# Patient Record
Sex: Male | Born: 1995 | Race: Black or African American | Hispanic: No | Marital: Single | Smoking: Current every day smoker
Health system: Southern US, Community
[De-identification: ages and names within clinical notes are randomized; demographics above are authoritative.]

---
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
## 2016-05-24 ENCOUNTER — Encounter (HOSPITAL_COMMUNITY): Payer: Self-pay | Admitting: Emergency Medicine

## 2016-05-24 ENCOUNTER — Ambulatory Visit (HOSPITAL_COMMUNITY)
Admission: EM | Admit: 2016-05-24 | Discharge: 2016-05-24 | Disposition: A | Discharge status: Home / Self Care | Payer: BLUE CROSS/BLUE SHIELD | Attending: Emergency Medicine | Admitting: Emergency Medicine

## 2016-05-24 DIAGNOSIS — L309 Dermatitis, unspecified: Secondary | ICD-10-CM

## 2016-05-24 MED ORDER — METHYLPREDNISOLONE 4 MG PO TBPK: ORAL_TABLET | ORAL | 0 refills | Status: DC

## 2016-05-24 MED ORDER — HYDROXYZINE HCL 25 MG PO TABS: 25.0000 mg | ORAL_TABLET | Freq: Four times a day (QID) | ORAL | 0 refills | Status: DC

## 2016-05-24 NOTE — ED Triage Notes (Signed)
[facility] a [facility] itching when he gets hot. The patient stated that he does not sweat and thus when he gets hot he itches. The patient stated that this happened about 2 years ago.

## 2016-05-24 NOTE — ED Provider Notes (Signed)
CSN: [id number]     Arrival date & time 05/24/16  1357 History   First MD Initiated Contact with Patient 05/24/16 1544     Chief Complaint  Patient presents with  . Rash   (Consider location/radiation/quality/duration/timing/severity/associated sxs/prior Treatment) Patient c/o itching and rash on chest and arms.   The history is provided by the patient.  Rash  Location:  Shoulder/arm and hand Shoulder/arm rash location:  R shoulder Quality: itchiness   Severity:  Moderate Onset quality:  Sudden Duration:  1 day Timing:  Intermittent Progression:  Waxing and waning Chronicity:  New Relieved by:  Nothing Worsened by:  Nothing   History reviewed. No pertinent past medical history. History reviewed. No pertinent surgical history. History reviewed. No pertinent family history. Social History  Substance Use Topics  . Smoking status: Not on file  . Smokeless tobacco: Not on file  . Alcohol use Not on file    Review of Systems  Constitutional: Negative.   HENT: Negative.   Eyes: Negative.   Respiratory: Negative.   Cardiovascular: Negative.   Gastrointestinal: Negative.   Endocrine: Negative.   Genitourinary: Negative.   Musculoskeletal: Negative.   Skin: Positive for rash.  Allergic/Immunologic: Negative.   Neurological: Negative.     Allergies  Patient has no known allergies.  Home Medications   Prior to Admission medications   Medication Sig Start Date End Date Taking? Authorizing Provider  fexofenadine-pseudoephedrine (ALLEGRA-D) 60-120 MG per tablet Take 1 tablet by mouth every 12 (twelve) hours. 10/04/12   Reuben Likesavid C Keller, MD  fluticasone (FLONASE) 50 MCG/ACT nasal spray Place 2 sprays into the nose daily. 10/04/12   Reuben Likesavid C Keller, MD  guaiFENesin-codeine [facility](GUIATUSS AC) 100-10 MG/5ML syrup Take 10 mLs by mouth 4 (four) times daily as needed for cough. 10/04/12   Reuben Likesavid C Keller, MD  hydrOXYzine (ATARAX/VISTARIL) 25 MG tablet Take 1 tablet (25 mg total) by mouth  every 6 (six) hours. 05/24/16   Deatra CanterWilliam J Jonavin Seder, FNP  methylPREDNISolone (MEDROL DOSEPAK) 4 MG TBPK tablet Take 6-5-4-3-2-1 po qd 05/24/16   Deatra CanterWilliam J Grisel Blumenstock, FNP  predniSONE (DELTASONE) 20 MG tablet Take 1 tablet (20 mg total) by mouth 2 (two) times daily. 10/04/12   Reuben Likesavid C Keller, MD  terbinafine (LAMISIL) 1 % cream Apply topically 2 (two) times daily. 10/04/12   Reuben Likesavid C Keller, MD   Meds Ordered and Administered this Visit  Medications - No data to display  BP 125/79 (BP Location: Right Arm)   Pulse 69   Temp 98.2 F (36.8 C) (Oral)   Resp 16   SpO2 99%  No data found.   Physical Exam  Constitutional: He appears well-developed and well-nourished.  HENT:  Head: Normocephalic and atraumatic.  Eyes: EOM are normal. Pupils are equal, round, and reactive to light.  Neck: Normal range of motion. Neck supple.  Cardiovascular: Normal rate.   Pulmonary/Chest: Effort normal and breath sounds normal.  Skin: Rash noted.  Nursing note and vitals reviewed.   Urgent Care Course   Clinical Course     Procedures (including critical care time)  Labs Review Labs Reviewed - No data to display  Imaging Review No results found.   Visual Acuity Review  Right Eye Distance:   Left Eye Distance:   Bilateral Distance:    Right Eye Near:   Left Eye Near:    Bilateral Near:         MDM   1. Dermatitis    Medrol dose pack Hydroxyzine  Deatra CanterWilliam J Angelgabriel Willmore, FNP 05/24/16 1550

## 2016-06-20 ENCOUNTER — Ambulatory Visit (HOSPITAL_COMMUNITY)
Admission: EM | Admit: 2016-06-20 | Discharge: 2016-06-20 | Disposition: A | Discharge status: Home / Self Care | Payer: Self-pay | Attending: Family Medicine | Admitting: Family Medicine

## 2016-06-20 ENCOUNTER — Encounter (HOSPITAL_COMMUNITY): Payer: Self-pay | Admitting: *Deleted

## 2016-06-20 DIAGNOSIS — B356 Tinea cruris: Secondary | ICD-10-CM

## 2016-06-20 MED ORDER — CLOTRIMAZOLE-BETAMETHASONE 1-0.05 % EX CREA: TOPICAL_CREAM | CUTANEOUS | 1 refills | Status: DC

## 2016-06-20 NOTE — ED Triage Notes (Signed)
Also has   Some  Swelling of  lower lip  Noticed   Yesterday

## 2016-06-20 NOTE — ED Triage Notes (Signed)
Rash  On  Penis  That   Itched     Before    Stopped   sev   Days       denys    Any    Discharge  Or burning  On  Urination

## 2016-06-20 NOTE — ED Provider Notes (Addendum)
[facility]    CSN: [id number] Arrival date & time: 06/20/16  1159     History   Chief Complaint Chief Complaint  Patient presents with  . Rash    HPI Adam Kelley is a 21 y.o. male.   The history is provided by the patient and a parent.  Rash  Location:  Pelvis Pelvic rash location:  Penis Quality: itchiness and redness   Quality: not painful   Severity:  Mild Onset quality:  Gradual Duration:  1 week Progression:  Spreading Chronicity:  New Relieved by:  None tried Worsened by:  Nothing Ineffective treatments:  None tried Associated symptoms: no fever     History reviewed. No pertinent past medical history.  There are no active problems to display for this patient.   History reviewed. No pertinent surgical history.     Home Medications    Prior to Admission medications   Medication Sig Start Date End Date Taking? Authorizing Provider  clotrimazole-betamethasone (LOTRISONE) cream Apply to affected area 2 times daily prn 06/20/16   Adam Hoff, MD  fexofenadine-pseudoephedrine (ALLEGRA-D) 60-120 MG per tablet Take 1 tablet by mouth every 12 (twelve) hours. 10/04/12   Reuben Likes, MD  fluticasone (FLONASE) 50 MCG/ACT nasal spray Place 2 sprays into the nose daily. 10/04/12   Reuben Likes, MD  guaiFENesin-codeine [facility]) 100-10 MG/5ML syrup Take 10 mLs by mouth 4 (four) times daily as needed for cough. 10/04/12   Reuben Likes, MD  hydrOXYzine (ATARAX/VISTARIL) 25 MG tablet Take 1 tablet (25 mg total) by mouth every 6 (six) hours. 05/24/16   Deatra Canter, FNP  methylPREDNISolone (MEDROL DOSEPAK) 4 MG TBPK tablet Take 6-5-4-3-2-1 po qd 05/24/16   Deatra Canter, FNP  predniSONE (DELTASONE) 20 MG tablet Take 1 tablet (20 mg total) by mouth 2 (two) times daily. 10/04/12   Reuben Likes, MD  terbinafine (LAMISIL) 1 % cream Apply topically 2 (two) times daily. 10/04/12   Reuben Likes, MD    Family History History reviewed. No pertinent  family history.  Social History Social History  Substance Use Topics  . Smoking status: Never Smoker  . Smokeless tobacco: Never Used  . Alcohol use No     Allergies   Patient has no known allergies.   Review of Systems Review of Systems  Constitutional: Negative for fever.  Musculoskeletal: Negative.   Skin: Positive for rash.  Neurological: Negative.      Physical Exam Triage Vital Signs ED Triage Vitals [06/20/16 1343]  Enc Vitals Group     BP 168/70     Pulse Rate 78     Resp 18     Temp 97.5 F (36.4 C)     Temp src      SpO2 100 %     Weight      Height      Head Circumference      Peak Flow      Pain Score      Pain Loc      Pain Edu?      Excl. in GC?    No data found.   Updated Vital Signs BP 168/70 (BP Location: Left Arm)   Pulse 78   Temp 97.5 F (36.4 C)   Resp 18   SpO2 100%   Visual Acuity Right Eye Distance:   Left Eye Distance:   Bilateral Distance:    Right Eye Near:   Left Eye Near:  Bilateral Near:     Physical Exam  Constitutional: He is oriented to person, place, and time. He appears well-developed and well-nourished. No distress.  Neurological: He is alert and oriented to person, place, and time. No cranial nerve deficit.  Skin: Skin is warm and dry. Rash noted.  Nursing note and vitals reviewed.    UC Treatments / Results  Labs (all labs ordered are listed, but only abnormal results are displayed) Labs Reviewed - No data to display  EKG  EKG Interpretation None       Radiology No results found.  Procedures Procedures (including critical care time)  Medications Ordered in UC Medications - No data to display   Initial Impression / Assessment and Plan / UC Course  I have reviewed the triage vital signs and the nursing notes.  Pertinent labs & imaging results that were available during my care of the patient were reviewed by me and considered in my medical decision making (see chart for details).        Final Clinical Impressions(s) / UC Diagnoses   Final diagnoses:  Tinea cruris    New Prescriptions Discharge Medication List as of 06/20/2016  2:14 PM    START taking these medications   Details  clotrimazole-betamethasone (LOTRISONE) cream Apply to affected area 2 times daily prn, Normal         Adam HoffJames D Terell Kincy, MD 07/03/16 1512    Adam HoffJames D Shanisha Lech, MD 07/04/16 1044

## 2016-10-11 ENCOUNTER — Encounter (HOSPITAL_COMMUNITY): Payer: Self-pay | Admitting: *Deleted

## 2016-10-11 ENCOUNTER — Emergency Department (HOSPITAL_COMMUNITY)
Admission: EM | Admit: 2016-10-11 | Discharge: 2016-10-11 | Disposition: A | Discharge status: Home / Self Care | Payer: BLUE CROSS/BLUE SHIELD | Attending: Emergency Medicine | Admitting: Emergency Medicine

## 2016-10-11 DIAGNOSIS — Z79899 Other long term (current) drug therapy: Secondary | ICD-10-CM | POA: Diagnosis not present

## 2016-10-11 DIAGNOSIS — F329 Major depressive disorder, single episode, unspecified: Secondary | ICD-10-CM | POA: Diagnosis not present

## 2016-10-11 DIAGNOSIS — F32A Depression, unspecified: Secondary | ICD-10-CM

## 2016-10-11 DIAGNOSIS — R45851 Suicidal ideations: Secondary | ICD-10-CM | POA: Diagnosis not present

## 2016-10-11 DIAGNOSIS — Z046 Encounter for general psychiatric examination, requested by authority: Secondary | ICD-10-CM | POA: Diagnosis present

## 2016-10-11 LAB — CBC
HEMATOCRIT: 50.3 % (ref 39.0–52.0)
HEMOGLOBIN: 17.3 g/dL — AB (ref 13.0–17.0)
MCH: 30.7 pg (ref 26.0–34.0)
MCHC: 34.4 g/dL (ref 30.0–36.0)
MCV: 89.3 fL (ref 78.0–100.0)
Platelets: 219 10*3/uL (ref 150–400)
RBC: 5.63 MIL/uL (ref 4.22–5.81)
RDW: 13.6 % (ref 11.5–15.5)
WBC: 10 10*3/uL (ref 4.0–10.5)

## 2016-10-11 LAB — RAPID URINE DRUG SCREEN, HOSP PERFORMED
AMPHETAMINES: NOT DETECTED
BARBITURATES: NOT DETECTED
Benzodiazepines: NOT DETECTED
COCAINE: NOT DETECTED
Opiates: NOT DETECTED
TETRAHYDROCANNABINOL: NOT DETECTED

## 2016-10-11 LAB — COMPREHENSIVE METABOLIC PANEL
ALBUMIN: 4.6 g/dL (ref 3.5–5.0)
ALK PHOS: 59 U/L (ref 38–126)
ALT: 22 U/L (ref 17–63)
AST: 27 U/L (ref 15–41)
Anion gap: 9 (ref 5–15)
BUN: 20 mg/dL (ref 6–20)
CALCIUM: 9.5 mg/dL (ref 8.9–10.3)
CHLORIDE: 104 mmol/L (ref 101–111)
CO2: 24 mmol/L (ref 22–32)
CREATININE: 1.43 mg/dL — AB (ref 0.61–1.24)
GFR calc Af Amer: >60 mL/min (ref >60)
GFR calc non Af Amer: >60 mL/min (ref >60)
GLUCOSE: 97 mg/dL (ref 65–99)
Potassium: 3.8 mmol/L (ref 3.5–5.1)
SODIUM: 137 mmol/L (ref 135–145)
Total Bilirubin: 0.6 mg/dL (ref 0.3–1.2)
Total Protein: 7.7 g/dL (ref 6.5–8.1)

## 2016-10-11 LAB — SALICYLATE LEVEL: Salicylate Lvl: <7 mg/dL (ref 2.8–30.0)

## 2016-10-11 LAB — ACETAMINOPHEN LEVEL: Acetaminophen (Tylenol), Serum: <10 ug/mL — ABNORMAL LOW (ref 10–30)

## 2016-10-11 LAB — ETHANOL: Alcohol, Ethyl (B): <5 mg/dL (ref <5)
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
## 2016-10-11 NOTE — Discharge Instructions (Addendum)
Follow-up with outpatient counseling as recommended by crisis counselors.  Return to the emergency department if your symptoms significantly worsen or change.

## 2016-10-11 NOTE — ED Provider Notes (Addendum)
[facility]PT Provider Note   CSN: [id number] Arrival date & time: 10/11/16  [id number]  By signing my name below, I, Modena JanskyAlbert Thayil, attest that this documentation has been prepared under the direction and in the presence of Melene PlanFloyd, Shawnise Peterkin, DO. Electronically Signed: Modena JanskyAlbert Thayil, Scribe. 10/11/2016. 3:09 AM.  History   Chief Complaint Chief Complaint  Patient presents with  . Psychiatric Evaluation   The history is provided by the patient. No language interpreter was used.   HPI Comments: Adam Kelley is a 21 y.o. male who presents to the Emergency Department for a psychiatric evaluation. He states he has been having suicidal thoughts recently. His mother passed away in 2016 but recall no recent events. No current plan for suicide nor prior hx of suicide attempt. Denies any chest pain, SOB, abdominal, vomiting, fever, or other complaints at this time.  History reviewed. No pertinent past medical history.  There are no active problems to display for this patient.   History reviewed. No pertinent surgical history.     Home Medications    Prior to Admission medications   Not on File    Family History No family history on file.  Social History Social History  Substance Use Topics  . Smoking status: Never Smoker  . Smokeless tobacco: Never Used  . Alcohol use No     Allergies   Patient has no known allergies.   Review of Systems Review of Systems  Constitutional: Negative for chills and fever.  HENT: Negative for congestion and facial swelling.   Eyes: Negative for discharge and visual disturbance.  Respiratory: Negative for shortness of breath.   Cardiovascular: Negative for chest pain and palpitations.  Gastrointestinal: Negative for abdominal pain, diarrhea and vomiting.  Musculoskeletal: Negative for arthralgias and myalgias.  Skin: Negative for color change and rash.  Neurological: Negative for tremors, syncope and headaches.  Psychiatric/Behavioral:  Positive for suicidal ideas. Negative for confusion and dysphoric mood.     Physical Exam Updated Vital Signs BP (!) 147/87 (BP Location: Left Arm)   Pulse (!) 105   Temp 98.4 F (36.9 C) (Oral)   Resp 16   Ht 5\' 8"  (1.727 m)   Wt 187 lb (84.8 kg)   SpO2 99%   BMI 28.43 kg/m   Physical Exam  Constitutional: He is oriented to person, place, and time. He appears well-developed and well-nourished.  HENT:  Head: Normocephalic and atraumatic.  Eyes: EOM are normal. Pupils are equal, round, and reactive to light.  Neck: Normal range of motion. Neck supple. No JVD present.  Cardiovascular: Normal rate and regular rhythm.  Exam reveals no gallop and no friction rub.   No murmur heard. Pulmonary/Chest: No respiratory distress. He has no wheezes.  Abdominal: He exhibits no distension. There is no rebound and no guarding.  Musculoskeletal: Normal range of motion.  Neurological: He is alert and oriented to person, place, and time.  Skin: No rash noted. No pallor.  Psychiatric: He has a normal mood and affect. His behavior is normal.  Nursing note and vitals reviewed.    ED Treatments / Results  DIAGNOSTIC STUDIES: Oxygen Saturation is 99% on RA, normal by my interpretation.    COORDINATION OF CARE: 3:13 AM- Pt advised of plan for treatment and pt agrees.  Labs (all labs ordered are listed, but only abnormal results are displayed) Labs Reviewed  CBC - Abnormal; Notable for the following:       Result Value   Hemoglobin 17.3 (*)  All other components within normal limits  COMPREHENSIVE METABOLIC PANEL  ETHANOL  SALICYLATE LEVEL  ACETAMINOPHEN LEVEL  RAPID URINE DRUG SCREEN, HOSP PERFORMED    EKG  EKG Interpretation None       Radiology No results found.  Procedures Procedures (including critical care time)  Medications Ordered in ED Medications - No data to display   Initial Impression / Assessment and Plan / ED Course  I have reviewed the triage vital  signs and the nursing notes.  Pertinent labs & imaging results that were available during my care of the patient were reviewed by me and considered in my medical decision making (see chart for details).     21 yo M with a chief complaint of suicidal ideation. Patient states she's been having issues since his mom died a few years ago. Nothing new is changed. He has no plan currently. No prior history of mental illness. I feel the patient is medically clear at this time. TTS eval.  The patients results and plan were reviewed and discussed.   Any x-rays performed were independently reviewed by myself.   Differential diagnosis were considered with the presenting HPI.  Medications - No data to display  Vitals:   10/11/16 0243 10/11/16 0247  BP:  (!) 147/87  Pulse:  (!) 105  Resp:  16  Temp:  98.4 F (36.9 C)  TempSrc:  Oral  SpO2:  99%  Weight:  187 lb (84.8 kg)  Height: 5\' 8"  (1.727 m) 5\' 8"  (1.727 m)    Final diagnoses:  Suicidal ideation    Admission/ observation were discussed with the admitting physician, patient and/or family and they are comfortable with the plan.    Final Clinical Impressions(s) / ED Diagnoses   Final diagnoses:  Suicidal ideation    New Prescriptions New Prescriptions   No medications on file   I personally performed the services described in this documentation, which was scribed in my presence. The recorded information has been reviewed and is accurate.      Melene Plan, DO 10/11/16 1610    Melene Plan, DO 10/11/16 [id number]    Melene Plan, DO 10/11/16 [phone]

## 2016-10-11 NOTE — ED Provider Notes (Signed)
Patient was seen and evaluated by TTS. They feel as though outpatient follow-up is appropriate. Patient is calm and cooperative and comfortable with the disposition. He will be discharged, to follow-up as recommended.   Adam Kelley, Coolidge Gossard, MD 10/11/16 [phone]

## 2016-10-11 NOTE — ED Notes (Signed)
Patient ambulated to the room independently no signs of distress noted

## 2016-10-11 NOTE — ED Notes (Signed)
TTS in process at this time  

## 2016-10-11 NOTE — ED Triage Notes (Signed)
The pt reports that he is suicidal no drugs or alcohol use.  No plan

## 2016-10-11 NOTE — [facility] Counselor (Signed)
Disposition provided to Charge Nurse, Maralyn SagoSarah, at [phone], due to Patient's MD(Dan Adela LankFloyd) and RN being unavailable due to ED trauma.   Elmore GuiseJaniah Jakki Doughty, LPCA Therapeutic Triage Specialist

## 2016-10-11 NOTE — ED Notes (Addendum)
Patient belongings and valuables returned to patient, prior to discharge. patient signed for receipt of all belongings and valuables. Pt provided outpatient mental health resources at discharge

## 2016-10-11 NOTE — ED Triage Notes (Signed)
Staffing has called a sitter and he is here.  Chg nurse notified of a sitter need security called to wand the pt

## 2016-10-11 NOTE — ED Notes (Signed)
1 labeled belongings bag noted in [facility] nurses' [facility]. Bag taken to Pod A d/t [facility] recommendation - Outpt Tx. Outpt resources given.

## 2019-09-28 ENCOUNTER — Other Ambulatory Visit: Payer: Self-pay

## 2019-09-28 ENCOUNTER — Encounter (HOSPITAL_COMMUNITY): Payer: Self-pay

## 2019-09-28 ENCOUNTER — Ambulatory Visit (HOSPITAL_COMMUNITY)
Admission: EM | Admit: 2019-09-28 | Discharge: 2019-09-28 | Disposition: A | Discharge status: Home / Self Care | Payer: BLUE CROSS/BLUE SHIELD | Attending: Emergency Medicine | Admitting: Emergency Medicine

## 2019-09-28 DIAGNOSIS — L0231 Cutaneous abscess of buttock: Secondary | ICD-10-CM

## 2019-09-28 MED ORDER — CEPHALEXIN 500 MG PO CAPS: 500.0000 mg | ORAL_CAPSULE | Freq: Four times a day (QID) | ORAL | 0 refills | Status: DC

## 2019-09-28 NOTE — ED Triage Notes (Signed)
Pt states he has abscess on his buttock X 2 weeks.

## 2019-09-28 NOTE — ED Provider Notes (Signed)
[facility]   [id number] 09/28/19 Arrival Time: 1053   [id number]  SUBJECTIVE:  Adam Kelley is a 24 y.o. male who presents to the urgent care with abscess on his buttocks for the past 2 weeks.  Denies drainage and redness.  Area is tender and hard to touch. Denies precipitating event.  He has not used any medication.  Nothing make his symptoms worse.  Denies chills, fever, nausea, vomiting, diarrhea, chest pain, chest tightness, confusion.   Review of Systems  Constitutional: Negative.   Respiratory: Negative.   Cardiovascular: Negative.   Skin: Positive for color change.  All other systems reviewed and are negative.    History reviewed. No pertinent past medical history. History reviewed. No pertinent surgical history. No Known Allergies No current facility-administered medications on file prior to encounter.   No current outpatient medications on file prior to encounter.   Social History   Socioeconomic History  . Marital status: Single    Spouse name: Not on file  . Number of children: Not on file  . Years of education: Not on file  . Highest education level: Not on file  Occupational History  . Not on file  Tobacco Use  . Smoking status: Current Every Day Smoker    Types: Cigars  . Smokeless tobacco: Never Used  Substance and Sexual Activity  . Alcohol use: No  . Drug use: Not on file  . Sexual activity: Not on file  Other Topics Concern  . Not on file  Social History Narrative  . Not on file   Social Determinants of Health   Financial Resource Strain:   . Difficulty of Paying Living Expenses:   Food Insecurity:   . Worried About Programme researcher, broadcasting/film/video in the Last Year:   . Barista in the Last Year:   Transportation Needs:   . Freight forwarder (Medical):   Marland Kitchen Lack of Transportation (Non-Medical):   Physical Activity:   . Days of Exercise per Week:   . Minutes of Exercise per Session:   Stress:   . Feeling of Stress :     Social Connections:   . Frequency of Communication with Friends and Family:   . Frequency of Social Gatherings with Friends and Family:   . Attends Religious Services:   . Active Member of Clubs or Organizations:   . Attends Banker Meetings:   Marland Kitchen Marital Status:   Intimate Partner Violence:   . Fear of Current or Ex-Partner:   . Emotionally Abused:   Marland Kitchen Physically Abused:   . Sexually Abused:    History reviewed. No pertinent family history.  OBJECTIVE:  Vitals:   09/28/19 1134 09/28/19 1140  BP:  135/90  Pulse:  87  Resp:  18  Temp:  98 F (36.7 C)  TempSrc:  Oral  SpO2:  99%  Weight: 236 lb 9.6 oz (107.3 kg)      General appearance: alert; no distress Chest: CTA bilaterally, no dyspnea or orthopnea Heart: RRR, no murmur, gallop or rub Skin: hard area on bottocks tender to touch, Size approximate a quarter size; no erythema, no active drainage. Psychological: alert and cooperative; normal mood and affect   ASSESSMENT & PLAN:  1. Cutaneous abscess of buttock    Abscess area is hard to touch, there is no drainage present.  Incision is not recommended at this time.  Patient is in agreement.  Keflex will be prescribed.  Was advised to return for worsening  of symptoms.  Meds ordered this encounter  Medications  . cephALEXin (KEFLEX) 500 MG capsule    Sig: Take 1 capsule (500 mg total) by mouth 4 (four) times daily.    Dispense:  20 capsule    Refill:  0   Discharge instruction Apply warm compresses 3-4x daily for 10-15 minutes Wash site daily with warm water and mild soap Take antibiotic as prescribed and to completion Follow up here or with PCP if symptoms persists Return or go to the ED if you have any new or worsening symptoms increased redness, swelling, pain, nausea, vomiting, fever, chills, etc...    Reviewed expectations re: course of current medical issues. Questions answered. Outlined signs and symptoms indicating need for more acute  intervention. Patient verbalized understanding. After Visit Summary given.          Emerson Monte, [place] 09/28/19 1204

## 2019-09-28 NOTE — Discharge Instructions (Addendum)
Apply warm compresses 3-4x daily for 10-15 minutes Wash site daily with warm water and mild soap Take antibiotic as prescribed and to completion Follow up here or with PCP if symptoms persists Return or go to the ED if you have any new or worsening symptoms increased redness, swelling, pain, nausea, vomiting, fever, chills, etc...  

## 2020-02-02 ENCOUNTER — Ambulatory Visit: Payer: Medicaid Other | Attending: Family

## 2020-02-02 DIAGNOSIS — Z23 Encounter for immunization: Secondary | ICD-10-CM

## 2020-02-10 NOTE — Progress Notes (Signed)
   Covid-19 Vaccination Clinic  Name:  Adam Kelley    MRN: [id number] DOB: 1995/11/14  02/10/2020  Mr. [name] was observed post Covid-19 immunization for 15 minutes without incident. He was provided with Vaccine Information Sheet and instruction to access the V-Safe system.   Mr. [name] was instructed to call 911 with any severe reactions post vaccine: Marland Kitchen Difficulty breathing  . Swelling of face and throat  . A fast heartbeat  . A bad rash all over body  . Dizziness and weakness   Immunizations Administered    Name Date Dose VIS Date Route   Pfizer COVID-19 Vaccine 02/02/2020 11:35 AM 0.3 mL 07/27/2018 Intramuscular   Manufacturer: [employer], [employer]   Lot: [id number]   NDC: [address]-4

## 2020-02-23 ENCOUNTER — Ambulatory Visit: Payer: Medicaid Other | Attending: Family

## 2020-02-23 DIAGNOSIS — Z23 Encounter for immunization: Secondary | ICD-10-CM

## 2020-04-19 ENCOUNTER — Emergency Department (HOSPITAL_COMMUNITY)
Admission: EM | Admit: 2020-04-19 | Discharge: 2020-04-20 | Disposition: A | Discharge status: Home / Self Care | Payer: Medicaid Other | Attending: Emergency Medicine | Admitting: Emergency Medicine

## 2020-04-19 ENCOUNTER — Encounter (HOSPITAL_COMMUNITY): Payer: Self-pay

## 2020-04-19 ENCOUNTER — Other Ambulatory Visit: Payer: Self-pay

## 2020-04-19 DIAGNOSIS — A64 Unspecified sexually transmitted disease: Secondary | ICD-10-CM | POA: Insufficient documentation

## 2020-04-19 DIAGNOSIS — Z5321 Procedure and treatment not carried out due to patient leaving prior to being seen by health care provider: Secondary | ICD-10-CM | POA: Insufficient documentation

## 2020-04-19 LAB — URINALYSIS, ROUTINE W REFLEX MICROSCOPIC
Bilirubin Urine: NEGATIVE
Glucose, UA: NEGATIVE mg/dL
Ketones, ur: NEGATIVE mg/dL
Leukocytes,Ua: NEGATIVE
Nitrite: NEGATIVE
Protein, ur: 30 mg/dL — AB
Specific Gravity, Urine: 1.026 (ref 1.005–1.030)
pH: 5 (ref 5.0–8.0)

## 2020-04-19 NOTE — ED Triage Notes (Signed)
Pt reports that he thinks he may have a STD, denies dysuria or discharge, states "it feels like I am aroused and I am not"

## 2020-04-20 ENCOUNTER — Encounter (HOSPITAL_COMMUNITY): Payer: Self-pay | Admitting: Emergency Medicine

## 2020-04-20 ENCOUNTER — Ambulatory Visit (HOSPITAL_COMMUNITY)
Admission: EM | Admit: 2020-04-20 | Discharge: 2020-04-20 | Disposition: A | Discharge status: Home / Self Care | Payer: Self-pay | Attending: Internal Medicine | Admitting: Internal Medicine

## 2020-04-20 ENCOUNTER — Other Ambulatory Visit: Payer: Self-pay

## 2020-04-20 DIAGNOSIS — Z113 Encounter for screening for infections with a predominantly sexual mode of transmission: Secondary | ICD-10-CM | POA: Insufficient documentation

## 2020-04-20 DIAGNOSIS — N4829 Other inflammatory disorders of penis: Secondary | ICD-10-CM

## 2020-04-20 LAB — POCT URINALYSIS DIPSTICK, ED / UC
Bilirubin Urine: NEGATIVE
Glucose, UA: NEGATIVE mg/dL
Ketones, ur: NEGATIVE mg/dL
Leukocytes,Ua: NEGATIVE
Nitrite: NEGATIVE
Protein, ur: 100 mg/dL — AB
Specific Gravity, Urine: >=1.03 (ref 1.005–1.030)
Urobilinogen, UA: 0.2 mg/dL (ref 0.0–1.0)
pH: 5.5 (ref 5.0–8.0)

## 2020-04-20 NOTE — Discharge Instructions (Addendum)
Your urine did not show any infection Sending swab for testing You can check my chart for results.

## 2020-04-20 NOTE — ED Notes (Signed)
Pt called x3 for room, no answer.  

## 2020-04-20 NOTE — ED Triage Notes (Addendum)
Patient c/o "penile pressure" x since Monday.   Patient states "it feels like I need to constantly orgasm but I can't get there".   Patient denies any dysuria, hematuria, or penile discharge.   Patient wants STD testing.

## 2020-04-23 LAB — CYTOLOGY, (ORAL, ANAL, URETHRAL) ANCILLARY ONLY
Chlamydia: NEGATIVE
Comment: NEGATIVE
Comment: NEGATIVE
Comment: NORMAL
Neisseria Gonorrhea: POSITIVE — AB
Trichomonas: POSITIVE — AB

## 2020-04-23 NOTE — ED Provider Notes (Signed)
[facility]    CSN: [id number] Arrival date & time: 04/20/20  3810      History   Chief Complaint Chief Complaint  Patient presents with  . Penile Problem    HPI STEPHANOS FAN is a 24 y.o. male.   Patient is a 24 year old male presents today with "penile pressure".  Since Monday.  Weird sensation in penile area.  No specific dysuria, hematuria or urinary frequency.  No penile discharge.  No testicle pain or swelling.  Concern for STD but no specific exposures.     History reviewed. No pertinent past medical history.  There are no problems to display for this patient.   History reviewed. No pertinent surgical history.     Home Medications    Prior to Admission medications   Not on File    Family History History reviewed. No pertinent family history.  Social History Social History   Tobacco Use  . Smoking status: Current Every Day Smoker    Types: Cigars  . Smokeless tobacco: Never Used  Substance Use Topics  . Alcohol use: No  . Drug use: Not on file     Allergies   Patient has no known allergies.   Review of Systems Review of Systems   Physical Exam Triage Vital Signs ED Triage Vitals  Enc Vitals Group     BP 04/20/20 0822 (!) 149/111     Pulse Rate 04/20/20 0822 (!) 115     Resp 04/20/20 0822 17     Temp 04/20/20 0822 98.6 F (37 C)     Temp Source 04/20/20 0822 Oral     SpO2 04/20/20 0822 100 %     Weight --      Height --      Head Circumference --      Peak Flow --      Pain Score 04/20/20 0818 0     Pain Loc --      Pain Edu? --      Excl. in GC? --    No data found.  Updated Vital Signs BP (!) 149/111 (BP Location: Right Arm)   Pulse (!) 115   Temp 98.6 F (37 C) (Oral)   Resp 17   SpO2 100%   Visual Acuity Right Eye Distance:   Left Eye Distance:   Bilateral Distance:    Right Eye Near:   Left Eye Near:    Bilateral Near:     Physical Exam Vitals and nursing note reviewed.  Constitutional:       Appearance: Normal appearance.  HENT:     Head: Normocephalic and atraumatic.     Nose: Nose normal.  Eyes:     Conjunctiva/sclera: Conjunctivae normal.  Pulmonary:     Effort: Pulmonary effort is normal.  Musculoskeletal:        General: Normal range of motion.     Cervical back: Normal range of motion.  Skin:    General: Skin is warm and dry.  Neurological:     Mental Status: He is alert.  Psychiatric:        Mood and Affect: Mood normal.      UC Treatments / Results  Labs (all labs ordered are listed, but only abnormal results are displayed) Labs Reviewed  POCT URINALYSIS DIPSTICK, ED / UC - Abnormal; Notable for the following components:      Result Value   Hgb urine dipstick TRACE (*)    Protein, ur 100 (*)  All other components within normal limits  CYTOLOGY, (ORAL, ANAL, URETHRAL) ANCILLARY ONLY    EKG   Radiology No results found.  Procedures Procedures (including critical care time)  Medications Ordered in UC Medications - No data to display  Initial Impression / Assessment and Plan / UC Course  I have reviewed the triage vital signs and the nursing notes.  Pertinent labs & imaging results that were available during my care of the patient were reviewed by me and considered in my medical decision making (see chart for details).     STD screening Urine with trace hemoglobin but no leuks. Swab sent for further testing. Follow up as needed for continued or worsening symptoms  Final Clinical Impressions(s) / UC Diagnoses   Final diagnoses:  Screening examination for STD (sexually transmitted disease)     Discharge Instructions     Your urine did not show any infection Sending swab for testing You can check my chart for results.     ED Prescriptions    None     PDMP not reviewed this encounter.   Janace Aris, NP 04/23/20 [phone]

## 2020-04-24 ENCOUNTER — Telehealth (HOSPITAL_COMMUNITY): Payer: Self-pay | Admitting: Emergency Medicine

## 2020-04-24 ENCOUNTER — Ambulatory Visit (HOSPITAL_COMMUNITY)
Admission: EM | Admit: 2020-04-24 | Discharge: 2020-04-24 | Disposition: A | Discharge status: Home / Self Care | Payer: Self-pay | Attending: Family Medicine | Admitting: Family Medicine

## 2020-04-24 ENCOUNTER — Other Ambulatory Visit: Payer: Self-pay

## 2020-04-24 DIAGNOSIS — A549 Gonococcal infection, unspecified: Secondary | ICD-10-CM

## 2020-04-24 MED ORDER — METRONIDAZOLE 500 MG PO TABS: 500.0000 mg | ORAL_TABLET | Freq: Two times a day (BID) | ORAL | 0 refills | Status: DC

## 2020-04-24 MED ORDER — CEFTRIAXONE SODIUM 500 MG IJ SOLR
INTRAMUSCULAR | Status: AC
  Filled 2020-04-24: qty 500

## 2020-04-24 MED ORDER — AZITHROMYCIN 250 MG PO TABS
ORAL_TABLET | ORAL | Status: AC
  Filled 2020-04-24: qty 8

## 2020-04-24 MED ORDER — CEFTRIAXONE SODIUM 500 MG IJ SOLR
500.0000 mg | Freq: Once | INTRAMUSCULAR | Status: AC
  Administered 2020-04-24: 500 mg via INTRAMUSCULAR

## 2020-04-24 MED ORDER — METRONIDAZOLE 500 MG PO TABS
ORAL_TABLET | ORAL | Status: AC
Start: 2020-04-24 — End: ?
  Filled 2020-04-24: qty 4

## 2020-04-24 MED ORDER — METRONIDAZOLE 500 MG PO TABS
2000.0000 mg | ORAL_TABLET | Freq: Once | ORAL | Status: AC
  Administered 2020-04-24: 2000 mg via ORAL

## 2020-04-24 MED ORDER — LIDOCAINE HCL (PF) 1 % IJ SOLN
INTRAMUSCULAR | Status: AC
  Filled 2020-04-24: qty 2

## 2020-04-24 NOTE — ED Provider Notes (Signed)
Seen previously for exposure to STD.  Was found to have gonorrhea and trichomonas.  Is here for treatment.   Eustace Moore, MD 04/24/20 [phone]

## 2020-05-02 ENCOUNTER — Ambulatory Visit (HOSPITAL_COMMUNITY)
Admission: EM | Admit: 2020-05-02 | Discharge: 2020-05-02 | Disposition: A | Discharge status: Home / Self Care | Payer: Self-pay | Attending: Emergency Medicine | Admitting: Emergency Medicine

## 2020-05-02 ENCOUNTER — Other Ambulatory Visit: Payer: Self-pay

## 2020-05-02 ENCOUNTER — Encounter (HOSPITAL_COMMUNITY): Payer: Self-pay

## 2020-05-02 DIAGNOSIS — Z113 Encounter for screening for infections with a predominantly sexual mode of transmission: Secondary | ICD-10-CM | POA: Insufficient documentation

## 2020-05-02 NOTE — ED Provider Notes (Signed)
____________________________________________  Time seen: Approximately 8:41 AM  I have reviewed the triage vital signs and the nursing notes.   HISTORY  Chief Complaint STD Testing   Historian Patient    HPI Adam Kelley is a 24 y.o. male presents to the urgent care with request to be tested again for STDs.  Patient was seen and evaluated on 1119 for STD testing and tested positive for gonorrhea and returned on 04/24/2020 for treatment.  Patient denies dysuria, hematuria, increased urinary frequency, penile discharge or impotence.  Patient states that he was asymptomatic before and wants testing to make sure that he is not currently infected.   History reviewed. No pertinent past medical history.   Immunizations up to date:  Yes.     History reviewed. No pertinent past medical history.  There are no problems to display for this patient.   History reviewed. No pertinent surgical history.  Prior to Admission medications   Medication Sig Start Date End Date Taking? Authorizing Provider  metroNIDAZOLE (FLAGYL) 500 MG tablet Take 1 tablet (500 mg total) by mouth 2 (two) times daily. 04/24/20   Lamptey, Britta Mccreedy, MD    Allergies Patient has no known allergies.  History reviewed. No pertinent family history.  Social History Social History   Tobacco Use  . Smoking status: Current Every Day Smoker    Types: Cigars  . Smokeless tobacco: Never Used  Substance Use Topics  . Alcohol use: No  . Drug use: Not on file     Review of Systems  Constitutional: No fever/chills Eyes:  No discharge ENT: No upper respiratory complaints. Respiratory: no cough. No SOB/ use of accessory muscles to breath Gastrointestinal:   No nausea, no vomiting.  No diarrhea.  No constipation. Musculoskeletal: Negative for musculoskeletal pain. Skin: Negative for rash, abrasions, lacerations, ecchymosis.    ____________________________________________   PHYSICAL EXAM:  VITAL  SIGNS: ED Triage Vitals  Enc Vitals Group     BP 05/02/20 0834 (!) 168/91     Pulse Rate 05/02/20 0834 99     Resp 05/02/20 0834 20     Temp 05/02/20 0834 99.1 F (37.3 C)     Temp Source 05/02/20 0834 Oral     SpO2 05/02/20 0834 100 %     Weight --      Height --      Head Circumference --      Peak Flow --      Pain Score 05/02/20 0833 0     Pain Loc --      Pain Edu? --      Excl. in GC? --      Constitutional: Alert and oriented. Well appearing and in no acute distress. Eyes: Conjunctivae are normal. PERRL. EOMI. Head: Atraumatic. Cardiovascular: Normal rate, regular rhythm. Normal S1 and S2.  Good peripheral circulation. Respiratory: Normal respiratory effort without tachypnea or retractions. Lungs CTAB. Good air entry to the bases with no decreased or absent breath sounds Gastrointestinal: Bowel sounds x 4 quadrants. Soft and nontender to palpation. No guarding or rigidity. No distention. Musculoskeletal: Full range of motion to all extremities. No obvious deformities noted Neurologic:  Normal for age. No gross focal neurologic deficits are appreciated.  Skin:  Skin is warm, dry and intact. No rash noted. Psychiatric: Mood and affect are normal for age. Speech and behavior are normal.   ____________________________________________   LABS (all labs ordered are listed, but only abnormal results are displayed)  Labs Reviewed  CYTOLOGY, (ORAL,  ANAL, URETHRAL) ANCILLARY ONLY   ____________________________________________  EKG   ____________________________________________  RADIOLOGY  No results found.  ____________________________________________    PROCEDURES  Procedure(s) performed:     Procedures     Medications - No data to display   ____________________________________________   INITIAL IMPRESSION / ASSESSMENT AND PLAN / ED COURSE  Pertinent labs & imaging results that were available during my care of the patient were reviewed by me and  considered in my medical decision making (see chart for details).      Assessment and plan STD testing 24 year old male presents to the urgent care requesting repeat STD testing.  Notes were reviewed from prior urgent care encounters and I see that patient presented on 04/20/2020 for STD testing and tested positive for gonorrhea.  He returned for treatment on 04/24/2020.  Patient is currently asymptomatic at this time.  Patient will undergo repeat testing for gonorrhea, chlamydia and trichomoniasis.  All patient questions were answered    ____________________________________________  FINAL CLINICAL IMPRESSION(S) / ED DIAGNOSES  Final diagnoses:  Screening for STDs (sexually transmitted diseases)      NEW MEDICATIONS STARTED DURING THIS VISIT:  ED Discharge Orders    None          This chart was dictated using voice recognition software/Dragon. Despite best efforts to proofread, errors can occur which can change the meaning. Any change was purely unintentional.     Orvil Feil, PA-C 05/02/20 1050

## 2020-05-02 NOTE — Discharge Instructions (Signed)
Your results with post your MyChart

## 2020-05-02 NOTE — ED Triage Notes (Signed)
Pt presents for STD testing; unsure of whether he has symptoms and unwilling to share.

## 2020-05-03 LAB — CYTOLOGY, (ORAL, ANAL, URETHRAL) ANCILLARY ONLY
Comment: NEGATIVE
Trichomonas: NEGATIVE

## 2020-09-28 ENCOUNTER — Encounter (HOSPITAL_COMMUNITY): Payer: Self-pay

## 2020-09-28 ENCOUNTER — Ambulatory Visit (HOSPITAL_COMMUNITY)
Admission: EM | Admit: 2020-09-28 | Discharge: 2020-09-28 | Disposition: A | Discharge status: Home / Self Care | Payer: BC Managed Care – PPO | Attending: Emergency Medicine | Admitting: Emergency Medicine

## 2020-09-28 ENCOUNTER — Other Ambulatory Visit: Payer: Self-pay

## 2020-09-28 DIAGNOSIS — H66001 Acute suppurative otitis media without spontaneous rupture of ear drum, right ear: Secondary | ICD-10-CM

## 2020-09-28 MED ORDER — FLUTICASONE PROPIONATE 50 MCG/ACT NA SUSP: 1.0000 | Freq: Every day | NASAL | 2 refills | Status: DC

## 2020-09-28 MED ORDER — AMOXICILLIN-POT CLAVULANATE 875-125 MG PO TABS: 1.0000 | ORAL_TABLET | Freq: Two times a day (BID) | ORAL | 0 refills | Status: DC

## 2020-09-28 NOTE — ED Provider Notes (Signed)
[facility]    CSN: [id number] Arrival date & time: 09/28/20  4680      History   Chief Complaint Chief Complaint  Patient presents with  . Ear Fullness    HPI Adam Kelley is a 25 y.o. male.   Adam Kelley presents with complaints of right ear pain which started two days ago. Noted some clear drainage yesterday? No fevers. Has had allergy symptoms. Zyrtec did provide some temporary relief yesterday. Some decreased hearing. No previous similar. State he had been itching in his ear with his finger but denies  Any other objects into the ear. No blood from the ear.    ROS per HPI, negative if not otherwise mentioned.      History reviewed. No pertinent past medical history.  There are no problems to display for this patient.   History reviewed. No pertinent surgical history.     Home Medications    Prior to Admission medications   Medication Sig Start Date End Date Taking? Authorizing Provider  amoxicillin-clavulanate (AUGMENTIN) 875-125 MG tablet Take 1 tablet by mouth every 12 (twelve) hours. 09/28/20  Yes Reginald Mangels, Dorene Grebe B, NP  fluticasone (FLONASE) 50 MCG/ACT nasal spray Place 1 spray into both nostrils daily. 09/28/20  Yes Jamyia Fortune, Dorene Grebe B, NP  metroNIDAZOLE (FLAGYL) 500 MG tablet Take 1 tablet (500 mg total) by mouth 2 (two) times daily. 04/24/20   Lamptey, Britta Mccreedy, MD    Family History History reviewed. No pertinent family history.  Social History Social History   Tobacco Use  . Smoking status: Current Every Day Smoker    Types: Cigars  . Smokeless tobacco: Never Used  Substance Use Topics  . Alcohol use: No     Allergies   Patient has no known allergies.   Review of Systems Review of Systems   Physical Exam Triage Vital Signs ED Triage Vitals  Enc Vitals Group     BP 09/28/20 0824 (!) 157/98     Pulse Rate 09/28/20 0824 83     Resp 09/28/20 0824 18     Temp 09/28/20 0824 98.8 F (37.1 C)     Temp Source 09/28/20 0824  Oral     SpO2 09/28/20 0824 98 %     Weight --      Height --      Head Circumference --      Peak Flow --      Pain Score 09/28/20 0825 2     Pain Loc --      Pain Edu? --      Excl. in GC? --    No data found.  Updated Vital Signs BP (!) 157/98 (BP Location: Left Arm)   Pulse 83   Temp 98.8 F (37.1 C) (Oral)   Resp 18   SpO2 98%   Visual Acuity Right Eye Distance:   Left Eye Distance:   Bilateral Distance:    Right Eye Near:   Left Eye Near:    Bilateral Near:     Physical Exam Constitutional:      Appearance: He is well-developed.  HENT:     Right Ear: A middle ear effusion is present. Tympanic membrane is injected.     Left Ear: A middle ear effusion is present.     Ears:     Comments: Scant white fluid within the canal without redness or tenderness Cardiovascular:     Rate and Rhythm: Normal rate.  Pulmonary:     Effort: Pulmonary  effort is normal.  Skin:    General: Skin is warm and dry.  Neurological:     Mental Status: He is alert and oriented to person, place, and time.      UC Treatments / Results  Labs (all labs ordered are listed, but only abnormal results are displayed) Labs Reviewed - No data to display  EKG   Radiology No results found.  Procedures Procedures (including critical care time)  Medications Ordered in UC Medications - No data to display  Initial Impression / Assessment and Plan / UC Course  I have reviewed the triage vital signs and the nursing notes.  Pertinent labs & imaging results that were available during my care of the patient were reviewed by me and considered in my medical decision making (see chart for details).     Exam consistent with AOM of right ear with antibiotics provided. ROS per HPI, negative if not otherwise mentioned. Patient verbalized understanding and agreeable to plan.    Final Clinical Impressions(s) / UC Diagnoses   Final diagnoses:  Acute suppurative otitis media of right ear without  spontaneous rupture of tympanic membrane, recurrence not specified     Discharge Instructions     Complete course of antibiotics.  Push fluids to ensure adequate hydration and keep secretions thin.  Daily zyrtec still may help. I also recommend use of daily flonase.  If symptoms worsen or do not improve in the next week to return to be seen or to follow up with your PCP.     ED Prescriptions    Medication Sig Dispense Auth. Provider   amoxicillin-clavulanate (AUGMENTIN) 875-125 MG tablet Take 1 tablet by mouth every 12 (twelve) hours. 14 tablet Serrena Linderman, Dorene Grebe B, NP   fluticasone (FLONASE) 50 MCG/ACT nasal spray Place 1 spray into both nostrils daily. 16 g Georgetta Haber, NP     PDMP not reviewed this encounter.   Georgetta Haber, NP 09/28/20 1106

## 2020-09-28 NOTE — ED Triage Notes (Signed)
Pt presents with right ear fullness and drainage X 2 days.

## 2020-09-28 NOTE — Discharge Instructions (Signed)
Complete course of antibiotics.  Push fluids to ensure adequate hydration and keep secretions thin.  Daily zyrtec still may help. I also recommend use of daily flonase.  If symptoms worsen or do not improve in the next week to return to be seen or to follow up with your PCP.

## 2021-06-28 ENCOUNTER — Ambulatory Visit (HOSPITAL_COMMUNITY)
Admission: EM | Admit: 2021-06-28 | Discharge: 2021-06-28 | Disposition: A | Discharge status: Home / Self Care | Payer: 59 | Attending: Physician Assistant | Admitting: Physician Assistant

## 2021-06-28 ENCOUNTER — Encounter (HOSPITAL_COMMUNITY): Payer: Self-pay | Admitting: *Deleted

## 2021-06-28 ENCOUNTER — Other Ambulatory Visit: Payer: Self-pay

## 2021-06-28 DIAGNOSIS — Z113 Encounter for screening for infections with a predominantly sexual mode of transmission: Secondary | ICD-10-CM | POA: Diagnosis present

## 2021-06-28 DIAGNOSIS — R030 Elevated blood-pressure reading, without diagnosis of hypertension: Secondary | ICD-10-CM | POA: Diagnosis not present

## 2021-06-28 LAB — HIV ANTIBODY (ROUTINE TESTING W REFLEX): HIV Screen 4th Generation wRfx: NONREACTIVE

## 2021-06-28 LAB — HEPATITIS C ANTIBODY: HCV Ab: NONREACTIVE

## 2021-06-28 NOTE — ED Triage Notes (Signed)
PT reports he is just here for a check up. Pt has nos Sx's

## 2021-06-28 NOTE — ED Provider Notes (Signed)
[facility]    CSN: [id number] Arrival date & time: 06/28/21  1624      History   Chief Complaint Chief Complaint  Patient presents with   check up    HPI Adam Kelley is a 26 y.o. male.   Patient presents today for routine STI screening.  He is sexually active with male partners and does not consistently use condoms.  He does have a history of STI but has completed treatment and had negative test of cure.  He denies any current symptoms including abdominal pain, fever, nausea, vomiting, dysuria, urinary frequency, penile discharge.  He is interested in HIV, hepatitis, syphilis testing as well.  Denies any recent antibiotics.  Patient was noted to have blood pressure in clinic today as well as at previous clinic visits.  He reports whitecoat syndrome and anxiety with doctors visits.  He does monitor his blood pressure occasionally at home and this is generally at goal.  He reports eating a healthy diet and exercising regularly.  Denies any chest pain, shortness of breath, leg swelling, headache, vision changes.  He denies history of hypertension or taking antihypertensive medications in the past.   History reviewed. No pertinent past medical history.  There are no problems to display for this patient.   History reviewed. No pertinent surgical history.     Home Medications    Prior to Admission medications   Medication Sig Start Date End Date Taking? Authorizing Provider  fluticasone (FLONASE) 50 MCG/ACT nasal spray Place 1 spray into both nostrils daily. 09/28/20   Zigmund Gottron, NP    Family History History reviewed. No pertinent family history.  Social History Social History   Tobacco Use   Smoking status: Every Day    Types: Cigars   Smokeless tobacco: Never  Substance Use Topics   Alcohol use: No     Allergies   Patient has no known allergies.   Review of Systems Review of Systems  Constitutional:  Negative for activity change,  appetite change, fatigue and fever.  Eyes:  Negative for visual disturbance.  Respiratory:  Negative for cough and shortness of breath.   Cardiovascular:  Negative for chest pain.  Gastrointestinal:  Negative for abdominal pain, diarrhea, nausea and vomiting.  Genitourinary:  Negative for dysuria, frequency, penile discharge and urgency.  Neurological:  Negative for dizziness, light-headedness and headaches.    Physical Exam Triage Vital Signs ED Triage Vitals  Enc Vitals Group     BP 06/28/21 1652 (!) 160/92     Pulse Rate 06/28/21 1652 98     Resp 06/28/21 1652 16     Temp 06/28/21 1652 99.4 F (37.4 C)     Temp src --      SpO2 06/28/21 1652 97 %     Weight --      Height --      Head Circumference --      Peak Flow --      Pain Score 06/28/21 1650 0     Pain Loc --      Pain Edu? --      Excl. in [place]? --    No data found.  Updated Vital Signs BP (!) 160/92    Pulse 98    Temp 99.4 F (37.4 C)    Resp 16    SpO2 97%   Visual Acuity Right Eye Distance:   Left Eye Distance:   Bilateral Distance:    Right Eye Near:   Left  Eye Near:    Bilateral Near:     Physical Exam Vitals reviewed.  Constitutional:      General: He is awake.     Appearance: Normal appearance. He is well-developed. He is not ill-appearing.     Comments: Very pleasant male appears stated age in no acute distress sitting comfortably in exam room  HENT:     Head: Normocephalic and atraumatic.     Mouth/Throat:     Pharynx: Uvula midline. No oropharyngeal exudate or posterior oropharyngeal erythema.  Cardiovascular:     Rate and Rhythm: Normal rate and regular rhythm.     Heart sounds: Normal heart sounds, S1 normal and S2 normal. No murmur heard. Pulmonary:     Effort: Pulmonary effort is normal.     Breath sounds: Normal breath sounds. No stridor. No wheezing, rhonchi or rales.     Comments: Clear to auscultation bilaterally Abdominal:     General: Bowel sounds are normal.     Palpations:  Abdomen is soft.     Tenderness: There is no abdominal tenderness.  Neurological:     Mental Status: He is alert.  Psychiatric:        Behavior: Behavior is cooperative.     UC Treatments / Results  Labs (all labs ordered are listed, but only abnormal results are displayed) Labs Reviewed  HIV ANTIBODY (ROUTINE TESTING W REFLEX)  HEPATITIS C ANTIBODY  RPR  CYTOLOGY, (ORAL, ANAL, URETHRAL) ANCILLARY ONLY    EKG   Radiology No results found.  Procedures Procedures (including critical care time)  Medications Ordered in UC Medications - No data to display  Initial Impression / Assessment and Plan / UC Course  I have reviewed the triage vital signs and the nursing notes.  Pertinent labs & imaging results that were available during my care of the patient were reviewed by me and considered in my medical decision making (see chart for details).     STI screening completed today.  Patient is asymptomatic so we will defer treatment until results are obtained.  Patient was instructed to abstain from sexual activity until receiving results.  Discussed the importance of safe sex practices.  Strict return precautions given to which he expressed understanding.  Discussed potential utility of adding blood pressure medication given elevated reading today and at previous visits.  Patient reports this is generally normal at home and he was encouraged to monitor this regularly and if persistently above 140/90 he should return to consider initiation of antihypertensive medication.  Recommended he continue with lifestyle modification including monitoring his diet for salt and exercising regularly.  If he has any chest pain, shortness of breath, headache, vision changes, dizziness in the setting of high blood pressure he needs to go to the emergency room.  Recommended follow-up with PCP within a few weeks for recheck.  Final Clinical Impressions(s) / UC Diagnoses   Final diagnoses:  Routine  screening for STI (sexually transmitted infection)  Elevated blood pressure reading     Discharge Instructions      We will contact you if any of your testing is positive.  Please abstain from sexual activity until you receive your results.  If you require treatment you should abstain from sexual activity until 7 days past completing treatment and all partners have been tested and treated.  It is important that you use condoms with each sexual encounter.  Your blood pressure is elevated today.  Please monitor this at home.  If persistently above 140/90 she  returns we can consider starting medication.  Monitor your diet for salt and continue with regular physical activity.  I do recommend you follow-up with your primary care provider or our clinic within a few weeks for recheck.  If you develop any chest pain, shortness of breath, leg swelling, headache, dizziness in the setting of high blood pressure you need to go to the emergency room.     ED Prescriptions   None    PDMP not reviewed this encounter.   Terrilee Croak, PA-C 06/28/21 1718

## 2021-06-28 NOTE — Discharge Instructions (Signed)
We will contact you if any of your testing is positive.  Please abstain from sexual activity until you receive your results.  If you require treatment you should abstain from sexual activity until 7 days past completing treatment and all partners have been tested and treated.  It is important that you use condoms with each sexual encounter.  Your blood pressure is elevated today.  Please monitor this at home.  If persistently above 140/90 she returns we can consider starting medication.  Monitor your diet for salt and continue with regular physical activity.  I do recommend you follow-up with your primary care provider or our clinic within a few weeks for recheck.  If you develop any chest pain, shortness of breath, leg swelling, headache, dizziness in the setting of high blood pressure you need to go to the emergency room.

## 2021-06-29 LAB — RPR: RPR Ser Ql: NONREACTIVE

## 2021-07-01 LAB — CYTOLOGY, (ORAL, ANAL, URETHRAL) ANCILLARY ONLY
Chlamydia: NEGATIVE
Comment: NEGATIVE
Comment: NEGATIVE
Comment: NORMAL
Neisseria Gonorrhea: NEGATIVE
Trichomonas: NEGATIVE

## 2022-09-30 ENCOUNTER — Ambulatory Visit (HOSPITAL_COMMUNITY)
Admission: EM | Admit: 2022-09-30 | Discharge: 2022-09-30 | Disposition: A | Discharge status: Home / Self Care | Payer: BC Managed Care – PPO | Attending: Family Medicine | Admitting: Family Medicine

## 2022-09-30 ENCOUNTER — Encounter (HOSPITAL_COMMUNITY): Payer: Self-pay

## 2022-09-30 DIAGNOSIS — Z202 Contact with and (suspected) exposure to infections with a predominantly sexual mode of transmission: Secondary | ICD-10-CM | POA: Insufficient documentation

## 2022-09-30 DIAGNOSIS — M25511 Pain in right shoulder: Secondary | ICD-10-CM | POA: Insufficient documentation

## 2022-09-30 LAB — HIV ANTIBODY (ROUTINE TESTING W REFLEX): HIV Screen 4th Generation wRfx: NONREACTIVE

## 2022-09-30 LAB — RPR: RPR Ser Ql: NONREACTIVE

## 2022-09-30 NOTE — ED Provider Notes (Signed)
[facility]    CSN: [id number] Arrival date & time: 09/30/22  0856      History   Chief Complaint Chief Complaint  Patient presents with   Shoulder Pain    HPI Adam HURRELL is a 27 y.o. male.    Shoulder Pain  Here with left shoulder pain, began bothering him about 1 month ago.  No trauma or fall.  He has been noticing it with certain weight lifting exercises and so had taken about a week off of shoulder exercises.  He may be improved a little bit but still certain motions like internal and external rotation and abduction are causing some pain.  No fever or rash; no numbness or tingling or motor weakness in the right arm.  His blood pressure is elevated here; no chest pain or headache  He does not have a regular doctor.  He also would like STD screening, including blood work; no referable symptoms  History reviewed. No pertinent past medical history.  There are no problems to display for this patient.   History reviewed. No pertinent surgical history.     Home Medications    Prior to Admission medications   Not on File    Family History History reviewed. No pertinent family history.  Social History Social History   Tobacco Use   Smoking status: Every Day    Types: Cigars   Smokeless tobacco: Never  Vaping Use   Vaping Use: Never used  Substance Use Topics   Alcohol use: Yes   Drug use: Never     Allergies   Patient has no known allergies.   Review of Systems Review of Systems   Physical Exam Triage Vital Signs ED Triage Vitals  Enc Vitals Group     BP 09/30/22 0948 (!) 166/102     Pulse Rate 09/30/22 0948 78     Resp 09/30/22 0948 16     Temp 09/30/22 0948 98.9 F (37.2 C)     Temp Source 09/30/22 0948 Oral     SpO2 09/30/22 0948 98 %     Weight --      Height --      Head Circumference --      Peak Flow --      Pain Score 09/30/22 0949 6     Pain Loc --      Pain Edu? --      Excl. in GC? --    No data  found.  Updated Vital Signs BP (!) 166/102 (BP Location: Right Arm)   Pulse 78   Temp 98.9 F (37.2 C) (Oral)   Resp 16   SpO2 98%   Visual Acuity Right Eye Distance:   Left Eye Distance:   Bilateral Distance:    Right Eye Near:   Left Eye Near:    Bilateral Near:     Physical Exam Vitals reviewed.  Constitutional:      General: He is not in acute distress.    Appearance: He is not ill-appearing, toxic-appearing or diaphoretic.  HENT:     Mouth/Throat:     Mouth: Mucous membranes are moist.  Eyes:     Extraocular Movements: Extraocular movements intact.     Conjunctiva/sclera: Conjunctivae normal.     Pupils: Pupils are equal, round, and reactive to light.  Cardiovascular:     Rate and Rhythm: Normal rate and regular rhythm.     Heart sounds: No murmur heard. Pulmonary:     Effort: Pulmonary effort  is normal.     Breath sounds: Normal breath sounds.  Musculoskeletal:     Cervical back: Neck supple.     Comments: There is normal range of motion about the shoulder, though active and passive range of motion does cause some discomfort.  No deformity  Lymphadenopathy:     Cervical: No cervical adenopathy.  Skin:    Coloration: Skin is not jaundiced or pale.  Neurological:     General: No focal deficit present.     Mental Status: He is alert and oriented to person, place, and time.  Psychiatric:        Behavior: Behavior normal.      UC Treatments / Results  Labs (all labs ordered are listed, but only abnormal results are displayed) Labs Reviewed  HIV ANTIBODY (ROUTINE TESTING W REFLEX)  RPR  CYTOLOGY, (ORAL, ANAL, URETHRAL) ANCILLARY ONLY    EKG   Radiology No results found.  Procedures Procedures (including critical care time)  Medications Ordered in UC Medications - No data to display  Initial Impression / Assessment and Plan / UC Course  I have reviewed the triage vital signs and the nursing notes.  Pertinent labs & imaging results that were  available during my care of the patient were reviewed by me and considered in my medical decision making (see chart for details).        We discussed that this is most likely muscular or rotator cuff in origin.  We decided against imaging here in the clinic.  He is given range of motion exercises, and STD screening is ordered.  Will notify him of any abnormalities and treat per protocol.  Assistance is requested to help him find a PCP, and he is given instructions on his discharge paperwork how to self schedule with the PCP on the [facility] health website Final Clinical Impressions(s) / UC Diagnoses   Final diagnoses:  Acute pain of right shoulder  Possible exposure to STD     Discharge Instructions      Staff will notify you of any positive test on the swab or blood work.  He can take Tylenol if the pain is ever persistent.  You can use the QR code/website at the back of the summary paperwork to schedule yourself a new patient appointment with primary care      ED Prescriptions   None    PDMP not reviewed this encounter.   Zenia Resides, MD 09/30/22 [phone]

## 2022-09-30 NOTE — ED Triage Notes (Addendum)
Patient c/o right shoulder pain x 1 month. Patient does lift weights and states he took a week off and states the pain was "some" better with certain movements.  Patient  states he hs not taken any medications.   Patient added at the end that he wanted STD screening.

## 2022-09-30 NOTE — Discharge Instructions (Signed)
Staff will notify you of any positive test on the swab or blood work.  He can take Tylenol if the pain is ever persistent.  You can use the QR code/website at the back of the summary paperwork to schedule yourself a new patient appointment with primary care

## 2022-10-01 LAB — CYTOLOGY, (ORAL, ANAL, URETHRAL) ANCILLARY ONLY
Chlamydia: NEGATIVE
Comment: NEGATIVE
Comment: NEGATIVE
Comment: NORMAL
Neisseria Gonorrhea: NEGATIVE
Trichomonas: NEGATIVE

## 2022-10-02 ENCOUNTER — Encounter (HOSPITAL_COMMUNITY): Payer: Self-pay
